# Patient Record
Sex: Female | Born: 1945 | Race: White | Hispanic: No | Smoking: Former smoker
Health system: Southern US, Community
[De-identification: ages and names within clinical notes are randomized; demographics above are authoritative.]

## PROBLEM LIST (undated history)

## (undated) DIAGNOSIS — M8580 Other specified disorders of bone density and structure, unspecified site: Secondary | ICD-10-CM

## (undated) DIAGNOSIS — A809 Acute poliomyelitis, unspecified: Secondary | ICD-10-CM

## (undated) DIAGNOSIS — G14 Postpolio syndrome: Secondary | ICD-10-CM

## (undated) HISTORY — DX: Other specified disorders of bone density and structure, unspecified site: M85.80

## (undated) HISTORY — DX: Postpolio syndrome: G14

## (undated) HISTORY — DX: Acute poliomyelitis, unspecified: A80.9

---
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
## 2015-03-29 ENCOUNTER — Encounter: Payer: Self-pay | Admitting: Internal Medicine

## 2015-03-29 ENCOUNTER — Ambulatory Visit
Admission: RE | Admit: 2015-03-29 | Discharge: 2015-03-29 | Disposition: A | Discharge status: Home / Self Care | Payer: PPO | Source: Ambulatory Visit | Attending: Internal Medicine | Admitting: Internal Medicine

## 2015-03-29 ENCOUNTER — Ambulatory Visit (INDEPENDENT_AMBULATORY_CARE_PROVIDER_SITE_OTHER): Payer: PPO | Admitting: Internal Medicine

## 2015-03-29 VITALS — BP 138/88 | HR 80 | Ht 61.0 in | Wt 169.0 lb

## 2015-03-29 DIAGNOSIS — G14 Postpolio syndrome: Secondary | ICD-10-CM | POA: Diagnosis not present

## 2015-03-29 DIAGNOSIS — E559 Vitamin D deficiency, unspecified: Secondary | ICD-10-CM | POA: Insufficient documentation

## 2015-03-29 DIAGNOSIS — M8580 Other specified disorders of bone density and structure, unspecified site: Secondary | ICD-10-CM | POA: Diagnosis not present

## 2015-03-29 DIAGNOSIS — Z720 Tobacco use: Secondary | ICD-10-CM | POA: Diagnosis not present

## 2015-03-29 DIAGNOSIS — E785 Hyperlipidemia, unspecified: Secondary | ICD-10-CM | POA: Diagnosis not present

## 2015-03-29 DIAGNOSIS — J40 Bronchitis, not specified as acute or chronic: Secondary | ICD-10-CM | POA: Insufficient documentation

## 2015-03-29 DIAGNOSIS — F17201 Nicotine dependence, unspecified, in remission: Secondary | ICD-10-CM

## 2015-03-29 MED ORDER — LEVOFLOXACIN 500 MG PO TABS: 500.0000 mg | ORAL_TABLET | Freq: Every day | ORAL | Status: DC

## 2015-03-29 MED ORDER — ALBUTEROL SULFATE HFA 108 (90 BASE) MCG/ACT IN AERS
2.0000 | INHALATION_SPRAY | Freq: Four times a day (QID) | RESPIRATORY_TRACT | Status: DC | PRN
Start: 2015-03-29 — End: 2015-07-19

## 2015-03-29 NOTE — Progress Notes (Signed)
Date:  03/29/2015   Name:  Bailey Page   DOB:  07-16-1945   MRN:  [id number]   Chief Complaint: New Evaluation and Sinusitis Sinusitis This is a recurrent problem. The current episode started more than 1 year ago. The problem has been waxing and waning since onset. Associated symptoms include congestion, coughing, shortness of breath, sinus pressure and a sore throat. Pertinent negatives include no chills. Past treatments include antibiotics and oral decongestants.   She also reports a chronic cough for the past month and a half. Has been diagnosed with mycoplasma pneumonia twice in the past 15 years. Feels similar with dry cough mild shortness of breath and mild wheezing and fatigue.  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  This is better since retiring.  She takes advil. She also has fatigue that she suspects is related to postpolio syndrome. She has some diaphragmatic dysfunction that contributes to shortness of breath. Polio was at age 70 and resulted in shortening and weakness of her left leg.  Review of Systems  Constitutional: Positive for fatigue. Negative for fever, chills and unexpected weight change.  HENT: Positive for congestion, sinus pressure and sore throat.   Eyes: Negative for visual disturbance.  Respiratory: Positive for cough, shortness of breath and wheezing. Negative for choking and chest tightness.   Cardiovascular: Negative for chest pain, palpitations and leg swelling.  Gastrointestinal: Negative for abdominal pain and blood in stool.  Musculoskeletal: Positive for arthralgias and gait problem.  Skin: Negative for color change and rash.    Patient Active Problem List   Diagnosis Date Noted  . 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 Amoxicillin-Pot Clavulanate Diarrhea    Tolerates just Amoxicillin without difficulty per patient.  . Sulfa  Antibiotics Rash    Past Surgical History  Procedure Laterality Date  . Cesarean section  [id number]  . Muscle transfer left foot Left 1960  . Tonsillectomy  1952    Social History  Substance Use Topics  . Smoking status: Former Smoker    Quit date: 03/14/2007  . Smokeless tobacco: None  . Alcohol Use: No    Medication list has been reviewed and updated.   Physical Exam  Constitutional: She is oriented to person, place, and time. She appears well-developed. No distress.  HENT:  Head: Normocephalic and atraumatic.  Neck: Normal range of motion. Neck supple. No thyromegaly present.  Cardiovascular: Normal rate, regular rhythm and normal heart sounds.   Pulmonary/Chest: Effort normal. No respiratory distress. She has decreased breath sounds. She has no wheezes. She has no rhonchi.  Lymphadenopathy:    She has no cervical adenopathy.  Neurological: She is alert and oriented to person, place, and time.  Skin: Skin is warm and dry. No rash noted.  Psychiatric: She has a normal mood and affect. Her speech is normal and behavior is normal. Thought content normal.  Nursing note and vitals reviewed.   BP 146/88 mmHg  Pulse 80  Ht 5\' 1"  (1.549 m)  Wt 169 lb (76.658 kg)  BMI 31.95 kg/m2  Assessment and Plan: 1. Bronchitis Will refer to Pulmonary if persistent and/or CXR abnormal - levofloxacin (LEVAQUIN) 500 MG tablet; Take 1 tablet (500 mg total) by mouth daily.  Dispense: 10 tablet; Refill: 0 - DG Chest 2 View; Future - albuterol (PROVENTIL HFA;VENTOLIN HFA) 108 (90 Base) MCG/ACT inhaler; Inhale 2 puffs  into the lungs every 6 (six) hours as needed for wheezing or shortness of breath.  Dispense: 1 Inhaler; Refill: 0  2. Post poliomyelitis syndrome  3. Tobacco use disorder, mild, in sustained remission  4. Hyperlipidemia, mild No medication recommended at this time  5. Vitamin D deficiency Recommend Vitamin D 2000 IU daily  6. Osteopenia Recommend calcium 1200 mg per  day Weight bearing exercise as able   Bari Edward, MD [facility]  03/29/2015

## 2015-03-30 ENCOUNTER — Telehealth: Payer: Self-pay

## 2015-03-30 NOTE — Telephone Encounter (Signed)
-----   Message from Reubin Milan, MD sent at 03/29/2015  5:03 PM EST ----- CXR is normal.  Follow up after antibiotics if still having symptoms for Pulmonary referral.

## 2015-03-30 NOTE — Telephone Encounter (Signed)
Spoke with pt. Pt. Advised of all results and verbalized understanding. MAH 

## 2015-03-30 NOTE — Telephone Encounter (Signed)
Left message for patient to call back. MAH 

## 2015-06-29 ENCOUNTER — Encounter: Payer: PPO | Admitting: Internal Medicine

## 2015-07-19 ENCOUNTER — Ambulatory Visit (INDEPENDENT_AMBULATORY_CARE_PROVIDER_SITE_OTHER): Payer: PPO | Admitting: Internal Medicine

## 2015-07-19 ENCOUNTER — Encounter: Payer: Self-pay | Admitting: Internal Medicine

## 2015-07-19 VITALS — BP 126/78 | HR 67 | Resp 16 | Ht 60.0 in | Wt 169.0 lb

## 2015-07-19 DIAGNOSIS — M8580 Other specified disorders of bone density and structure, unspecified site: Secondary | ICD-10-CM

## 2015-07-19 DIAGNOSIS — G14 Postpolio syndrome: Secondary | ICD-10-CM

## 2015-07-19 DIAGNOSIS — Z23 Encounter for immunization: Secondary | ICD-10-CM | POA: Diagnosis not present

## 2015-07-19 DIAGNOSIS — Z0000 Encounter for general adult medical examination without abnormal findings: Secondary | ICD-10-CM | POA: Diagnosis not present

## 2015-07-19 DIAGNOSIS — E559 Vitamin D deficiency, unspecified: Secondary | ICD-10-CM

## 2015-07-19 DIAGNOSIS — Z1231 Encounter for screening mammogram for malignant neoplasm of breast: Secondary | ICD-10-CM

## 2015-07-19 DIAGNOSIS — E785 Hyperlipidemia, unspecified: Secondary | ICD-10-CM

## 2015-07-19 LAB — POCT URINALYSIS DIPSTICK
Bilirubin, UA: NEGATIVE
GLUCOSE UA: NEGATIVE
Ketones, UA: NEGATIVE
Leukocytes, UA: NEGATIVE
NITRITE UA: NEGATIVE
Protein, UA: NEGATIVE
RBC UA: NEGATIVE
Spec Grav, UA: 1.01
Urobilinogen, UA: 0.2
pH, UA: 7

## 2015-07-19 NOTE — Progress Notes (Signed)
Patient: Bailey Page, Female    DOB: January 25, 1946, 70 y.o.   MRN: [id number] Visit Date: 07/19/2015  Today's Provider: Bari Edward, MD   Chief Complaint  Patient presents with  . Medicare Wellness    discuss vaccines and mammogram    Subjective:    Annual wellness visit Ghada Abbett is a 70 y.o. female who presents today for her Subsequent Annual Wellness Visit. She feels fairly well. She reports exercising by walking. She reports she is sleeping fairly well. She is due for a mammogram.  ----------------------------------------------------------- HPI  Review of Systems  Constitutional: Negative for fever, chills and fatigue.  HENT: Negative for ear pain, hearing loss, postnasal drip and sinus pressure.   Eyes: Negative for visual disturbance.  Respiratory: Negative for cough, chest tightness, shortness of breath and wheezing.   Cardiovascular: Negative for chest pain, palpitations and leg swelling.  Gastrointestinal: Negative for abdominal pain and blood in stool.  Genitourinary: Negative for hematuria and difficulty urinating.  Musculoskeletal: Positive for arthralgias and gait problem. Negative for joint swelling.  Skin: Negative for rash.       Large keratosis on back  Neurological: Negative for dizziness, syncope and headaches.  Hematological: Negative for adenopathy.  Psychiatric/Behavioral: Negative for confusion, sleep disturbance and dysphoric mood.    Social History   Social History  . Marital Status: Single    Spouse Name: N/A  . Number of Children: N/A  . Years of Education: N/A   Occupational History  . Not on file.   Social History Main Topics  . Smoking status: Former Smoker -- 18.00 packs/day for 15 years    Types: Cigarettes    Quit date: 03/14/2007  . Smokeless tobacco: Not on file  . Alcohol Use: No  . Drug Use: No  . Sexual Activity: Not on file   Other Topics Concern  . Not on file   Social History Narrative   Moved to [facility] 2016  with same sex domestic partner.   Disabled due to post polio syndrome prior to [facility] and Medicare coverage    Patient Active Problem List   Diagnosis Date Noted  . 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 Hyperlipidemia, mild 03/29/2015  . Vitamin D deficiency 03/29/2015  . Osteopenia 03/29/2015  . Tobacco use disorder, mild, in sustained remission 03/29/2015    Past Surgical History  Procedure Laterality Date  . Cesarean section  [id number]  . Muscle transfer left foot Left 1960  . Tonsillectomy  1952    Her family history includes Hypertension in her mother; Throat cancer (age of onset: 21) in her father.    Previous Medications   ASCORBIC ACID (VITAMIN C) 100 MG TABLET    Take 100 mg by mouth daily.   CHOLECALCIFEROL (VITAMIN D PO)    Take 5,000 Units by mouth.   MULTIPLE VITAMIN (MULTIVITAMIN) TABLET    Take 1 tablet by mouth daily.    No care team member to display     Objective:   Vitals: BP 126/78 mmHg  Pulse 67  Resp 16  Ht 5' (1.524 m)  Wt 169 lb (76.658 kg)  BMI 33.01 kg/m2  SpO2 98%  Physical Exam  Constitutional: She is oriented to person, place, and time. She appears well-developed and well-nourished. No distress.  HENT:  Head: Normocephalic and atraumatic.  Right Ear: Tympanic membrane and ear canal normal.  Left Ear: Tympanic membrane and ear canal normal.  Nose: Right sinus exhibits no maxillary sinus tenderness. Left sinus exhibits no maxillary  sinus tenderness.  Mouth/Throat: Uvula is midline and oropharynx is clear and moist.  Eyes: Conjunctivae and EOM are normal. Right eye exhibits no discharge. Left eye exhibits no discharge. No scleral icterus.  Neck: Normal range of motion. Carotid bruit is not present. No erythema present. No thyromegaly present.  Cardiovascular: Normal rate, regular rhythm, normal heart sounds and normal pulses.   Pulmonary/Chest: Effort normal. No respiratory distress. She has no wheezes. Right breast exhibits tenderness.  Right breast exhibits no mass, no nipple discharge and no skin change. Left breast exhibits tenderness. Left breast exhibits no mass, no nipple discharge and no skin change.  Abdominal: Soft. Bowel sounds are normal. There is no hepatosplenomegaly. There is no tenderness. There is no CVA tenderness.  Musculoskeletal: Normal range of motion. She exhibits tenderness. She exhibits no edema.       Right knee: Normal.       Left knee: Normal.  Left leg shortened from polio  Lymphadenopathy:    She has no cervical adenopathy.    She has no axillary adenopathy.  Neurological: She is alert and oriented to person, place, and time. She has normal reflexes. No cranial nerve deficit or sensory deficit.  Skin: Skin is warm, dry and intact. No rash noted.     Psychiatric: She has a normal mood and affect. Her speech is normal and behavior is normal. Thought content normal.  Nursing note and vitals reviewed.   Activities of Daily Living In your present state of health, do you have any difficulty performing the following activities: 07/19/2015 03/29/2015  Hearing? N N  Vision? N Y  Difficulty concentrating or making decisions? N N  Walking or climbing stairs? N Y  Dressing or bathing? N N  Doing errands, shopping? N N  Preparing Food and eating ? N -  Using the Toilet? N -  In the past six months, have you accidently leaked urine? N -  Do you have problems with loss of bowel control? N -  Managing your Medications? N -  Managing your Finances? N -  Housekeeping or managing your Housekeeping? N -    Fall Risk Assessment Fall Risk  07/19/2015 03/29/2015  Falls in the past year? No No      Depression Screen PHQ 2/9 Scores 07/19/2015 03/29/2015  PHQ - 2 Score 0 2  PHQ- 9 Score - 4    Cognitive Testing - 6-CIT   Correct? Score   What year is it? yes 0 Yes = 0    No = 4  What month is it? yes 0 Yes = 0    No = 3  Remember:     Floyde ParkinsJohn Smith, [address] [place] [place], [place]     What time is it? yes 0 Yes =  0    No = 3  Count backwards from 20 to 1 yes 0 Correct = 0    1 error = 2   More than 1 error = 4  Say the months of the year in reverse. yes 0 Correct = 0    1 error = 2   More than 1 error = 4  What address did I ask you to remember? yes 0 Correct = 0  1 error = 2    2 error = 4    3 error = 6    4 error = 8    All wrong = 10       TOTAL SCORE  0/28   Interpretation:  Normal  Normal (0-7) Abnormal (8-28)        Medicare Annual Wellness Visit Summary:  Reviewed patient's Family Medical History Reviewed and updated list of patient's medical providers Assessment of cognitive impairment was done Assessed patient's functional ability Established a written schedule for health screening services Health Risk Assessent Completed and Reviewed  Exercise Activities and Dietary recommendations Goals    None      There is no immunization history for the selected administration types on file for this patient.  Health Maintenance  Topic Date Due  . TETANUS/TDAP  03/11/1965  . ZOSTAVAX  03/11/2006  . PNA vac Low Risk Adult (1 of 2 - PCV13) 03/12/2011  . MAMMOGRAM  03/13/2014  . INFLUENZA VACCINE  10/12/2015  . COLONOSCOPY  03/13/2018  . DEXA SCAN  Completed  . Hepatitis C Screening  Addressed     Discussed health benefits of physical activity, and encouraged her to engage in regular exercise appropriate for her age and condition.    ------------------------------------------------------------------------------------------------------------   Assessment & Plan:     1. Medicare annual wellness visit, subsequent completed - CBC with Differential/Platelet - TSH - POCT urinalysis dipstick  2. Post poliomyelitis syndrome stable  3. Hyperlipidemia, mild Continue dietary modifications - Lipid panel  4. Vitamin D deficiency On supplements - VITAMIN D 25 Hydroxy (Vit-D Deficiency, Fractures)  5. Osteopenia DEXA done in 2015 - Comprehensive metabolic panel  6. Encounter  for screening mammogram for breast cancer - MM DIGITAL SCREENING BILATERAL; Future  7. Need for diphtheria-tetanus-pertussis (Tdap) vaccine - Tdap vaccine greater than or equal to 7yo IM   Rx for Zostavax given to be obtained at the pharmacy after August 30, 2015. Prevnar-13 and PPV-23 schedule discussed.  Bari Edward, MD [facility]  07/19/2015

## 2015-07-19 NOTE — Patient Instructions (Addendum)
Td Vaccine (Tetanus and Diphtheria): What You Need to Know 1. Why get vaccinated? Tetanus  and diphtheria are very serious diseases. They are rare in the [place] today, but people who do become infected often have severe complications. Td vaccine is used to protect adolescents and adults from both of these diseases. Both tetanus and diphtheria are infections caused by bacteria. Diphtheria spreads from person to person through coughing or sneezing. Tetanus-causing bacteria enter the body through cuts, scratches, or wounds. TETANUS (Lockjaw) causes painful muscle tightening and stiffness, usually all over the body.  It can lead to tightening of muscles in the head and neck so you can't open your mouth, swallow, or sometimes even breathe. Tetanus kills about 1 out of every 10 people who are infected even after receiving the best medical care. DIPHTHERIA can cause a thick coating to form in the back of the throat.  It can lead to breathing problems, paralysis, heart failure, and death. Before vaccines, as many as 200,000 cases of diphtheria and hundreds of cases of tetanus were reported in the [place] each year. Since vaccination began, reports of cases for both diseases have dropped by about 99%. 2. Td vaccine Td vaccine can protect adolescents and adults from tetanus and diphtheria. Td is usually given as a booster dose every 10 years but it can also be given earlier after a severe and dirty wound or burn. Another vaccine, called Tdap, which protects against pertussis in addition to tetanus and diphtheria, is sometimes recommended instead of Td vaccine. Your doctor or the person giving you the vaccine can give you more information. Td may safely be given at the same time as other vaccines. 3. Some people should not get this vaccine  A person who has ever had a life-threatening allergic reaction after a previous dose of any tetanus or diphtheria containing vaccine, OR has a severe allergy  to any part of this vaccine, should not get Td vaccine. Tell the person giving the vaccine about any severe allergies.  Talk to your doctor if you:  have seizures or another nervous system problem,  had severe pain or swelling after any vaccine containing diphtheria or tetanus,  ever had a condition called Guillain Barre Syndrome (GBS),  aren't feeling well on the day the shot is scheduled. 4. Risks of a vaccine reaction With any medicine, including vaccines, there is a chance of side effects. These are usually mild and go away on their own. Serious reactions are also possible but are rare. Most people who get Td vaccine do not have any problems with it. Mild Problems  following Td vaccine: (Did not interfere with activities)  Pain where the shot was given (about 8 people in 10)  Redness or swelling where the shot was given (about 1 person in 4)  Mild fever (rare)  Headache (about 1 person in 4)  Tiredness (about 1 person in 4) Moderate Problems following Td vaccine: (Interfered with activities, but did not require medical attention)  Fever over 102F (rare) Severe Problems  following Td vaccine: (Unable to perform usual activities; required medical attention)  Swelling, severe pain, bleeding and/or redness in the arm where the shot was given (rare). Problems that could happen after any vaccine:  People sometimes faint after a medical procedure, including vaccination. Sitting or lying down for about 15 minutes can help prevent fainting, and injuries caused by a fall. Tell your doctor if you feel dizzy, or have vision changes or ringing in the ears.  Some people get severe pain in the shoulder and have difficulty moving the arm where a shot was given. This happens very rarely.  Any medication can cause a severe allergic reaction. Such reactions from a vaccine are very rare, estimated at fewer than 1 in a million doses, and would happen within a few minutes to a few hours after  the vaccination. As with any medicine, there is a very remote chance of a vaccine causing a serious injury or death. The safety of vaccines is always being monitored. For more information, visit: http://floyd.org/www.cdc.gov/vaccinesafety/ 5. What if there is a serious reaction? What should I look for?  Look for anything that concerns you, such as signs of a severe allergic reaction, very high fever, or unusual behavior. Signs of a severe allergic reaction can include hives, swelling of the face and throat, difficulty breathing, a fast heartbeat, dizziness, and weakness. These would usually start a few minutes to a few hours after the vaccination. What should I do?  If you think it is a severe allergic reaction or other emergency that can't wait, call 9-1-1 or get the person to the nearest hospital. Otherwise, call your doctor.  Afterward, the reaction should be reported to the Vaccine Adverse Event Reporting System (VAERS). Your doctor might file this report, or you can do it yourself through the VAERS web site at www.vaers.LAgents.nohhs.gov, or by calling 1-(563) 426-8155. VAERS does not give medical advice. 6. The National Vaccine Injury Compensation Program The [employer] Vaccine Injury Compensation Program (VICP) is a federal program that was created to compensate people who may have been injured by certain vaccines. Persons who believe they may have been injured by a vaccine can learn about the program and about filing a claim by calling 1-780-454-2159 or visiting the VICP website at SpiritualWord.atwww.hrsa.gov/vaccinecompensation. There is a time limit to file a claim for compensation. 7. How can I learn more?  Ask your doctor. He or she can give you the vaccine package insert or suggest other sources of information.  Call your local or state health department.  Contact the Centers for Disease Control and Prevention (CDC):  Call (302)694-55631-239-367-7500 (1-800-CDC-INFO)  Visit CDC's website at PicCapture.uywww.cdc.gov/vaccines CDC Td Vaccine VIS  (05/06/13)   This information is not intended to replace advice given to you by your health care provider. Make sure you discuss any questions you have with your health care provider.   Document Released: 12/25/2005 Document Revised: 03/20/2014 Document Reviewed: 06/11/2013 Elsevier Interactive Patient Education 2016 [employer]   Health Maintenance  Topic Date Due  . Janet BerlinETANUS/TDAP  03/11/1965  . ZOSTAVAX  03/11/2006  . PNA vac Low Risk Adult (1 of 2 - PCV13) 03/12/2011  . MAMMOGRAM  03/13/2014  . INFLUENZA VACCINE  10/12/2015  . COLONOSCOPY  03/13/2018  . DEXA SCAN  Completed  . Hepatitis C Screening  Addressed

## 2015-07-20 LAB — COMPREHENSIVE METABOLIC PANEL
A/G RATIO: 1.8 (ref 1.2–2.2)
ALT: 18 IU/L (ref 0–32)
AST: 20 IU/L (ref 0–40)
Albumin: 4.2 g/dL (ref 3.6–4.8)
Alkaline Phosphatase: 54 IU/L (ref 39–117)
BUN/Creatinine Ratio: 22 (ref 12–28)
BUN: 16 mg/dL (ref 8–27)
Bilirubin Total: 0.5 mg/dL (ref 0.0–1.2)
CALCIUM: 9.5 mg/dL (ref 8.7–10.3)
CO2: 23 mmol/L (ref 18–29)
CREATININE: 0.72 mg/dL (ref 0.57–1.00)
Chloride: 99 mmol/L (ref 96–106)
GFR, EST AFRICAN AMERICAN: 99 mL/min/{1.73_m2} (ref >59)
GFR, EST NON AFRICAN AMERICAN: 86 mL/min/{1.73_m2} (ref >59)
GLUCOSE: 102 mg/dL — AB (ref 65–99)
Globulin, Total: 2.3 g/dL (ref 1.5–4.5)
POTASSIUM: 4.9 mmol/L (ref 3.5–5.2)
Sodium: 138 mmol/L (ref 134–144)
TOTAL PROTEIN: 6.5 g/dL (ref 6.0–8.5)

## 2015-07-20 LAB — CBC WITH DIFFERENTIAL/PLATELET
BASOS ABS: 0 10*3/uL (ref 0.0–0.2)
Basos: 1 %
EOS (ABSOLUTE): 0.2 10*3/uL (ref 0.0–0.4)
Eos: 3 %
Hematocrit: 41.9 % (ref 34.0–46.6)
Hemoglobin: 14 g/dL (ref 11.1–15.9)
Immature Grans (Abs): 0 10*3/uL (ref 0.0–0.1)
Immature Granulocytes: 0 %
LYMPHS ABS: 2.3 10*3/uL (ref 0.7–3.1)
Lymphs: 44 %
MCH: 31.7 pg (ref 26.6–33.0)
MCHC: 33.4 g/dL (ref 31.5–35.7)
MCV: 95 fL (ref 79–97)
Monocytes Absolute: 0.4 10*3/uL (ref 0.1–0.9)
Monocytes: 7 %
NEUTROS ABS: 2.4 10*3/uL (ref 1.4–7.0)
Neutrophils: 45 %
PLATELETS: 206 10*3/uL (ref 150–379)
RBC: 4.42 x10E6/uL (ref 3.77–5.28)
RDW: 14.2 % (ref 12.3–15.4)
WBC: 5.3 10*3/uL (ref 3.4–10.8)

## 2015-07-20 LAB — LIPID PANEL
CHOL/HDL RATIO: 4 ratio (ref 0.0–4.4)
Cholesterol, Total: 219 mg/dL — ABNORMAL HIGH (ref 100–199)
HDL: 55 mg/dL (ref >39)
LDL CALC: 138 mg/dL — AB (ref 0–99)
TRIGLYCERIDES: 132 mg/dL (ref 0–149)
VLDL CHOLESTEROL CAL: 26 mg/dL (ref 5–40)

## 2015-07-20 LAB — VITAMIN D 25 HYDROXY (VIT D DEFICIENCY, FRACTURES): Vit D, 25-Hydroxy: 28.4 ng/mL — ABNORMAL LOW (ref 30.0–100.0)

## 2015-07-20 LAB — TSH: TSH: 1.39 u[IU]/mL (ref 0.450–4.500)

## 2017-03-14 IMAGING — CR DG CHEST 2V
2 series · 2 of 2 positions shown · non-contrast
Comparison: None.

CLINICAL DATA: Intermittent dry cough since February 2015. Initial
encounter.

EXAM:
CHEST  2 VIEW

[chest pa]
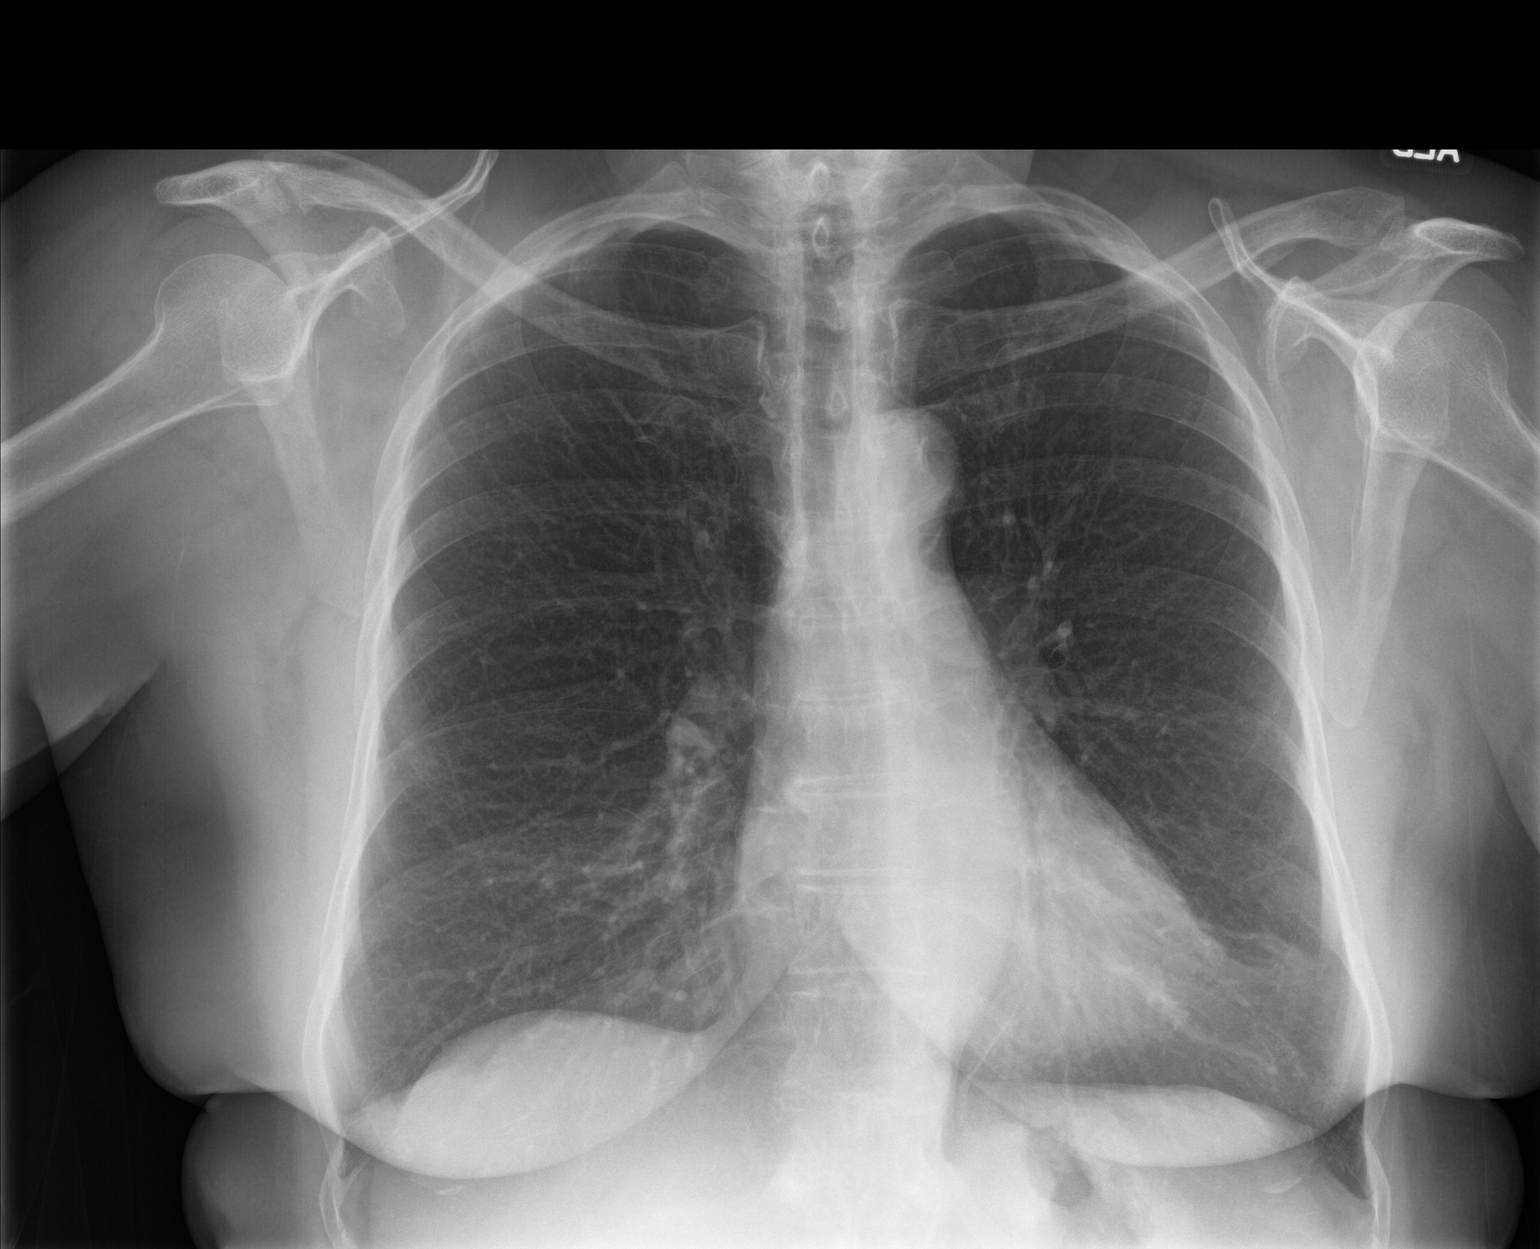

[chest lat]
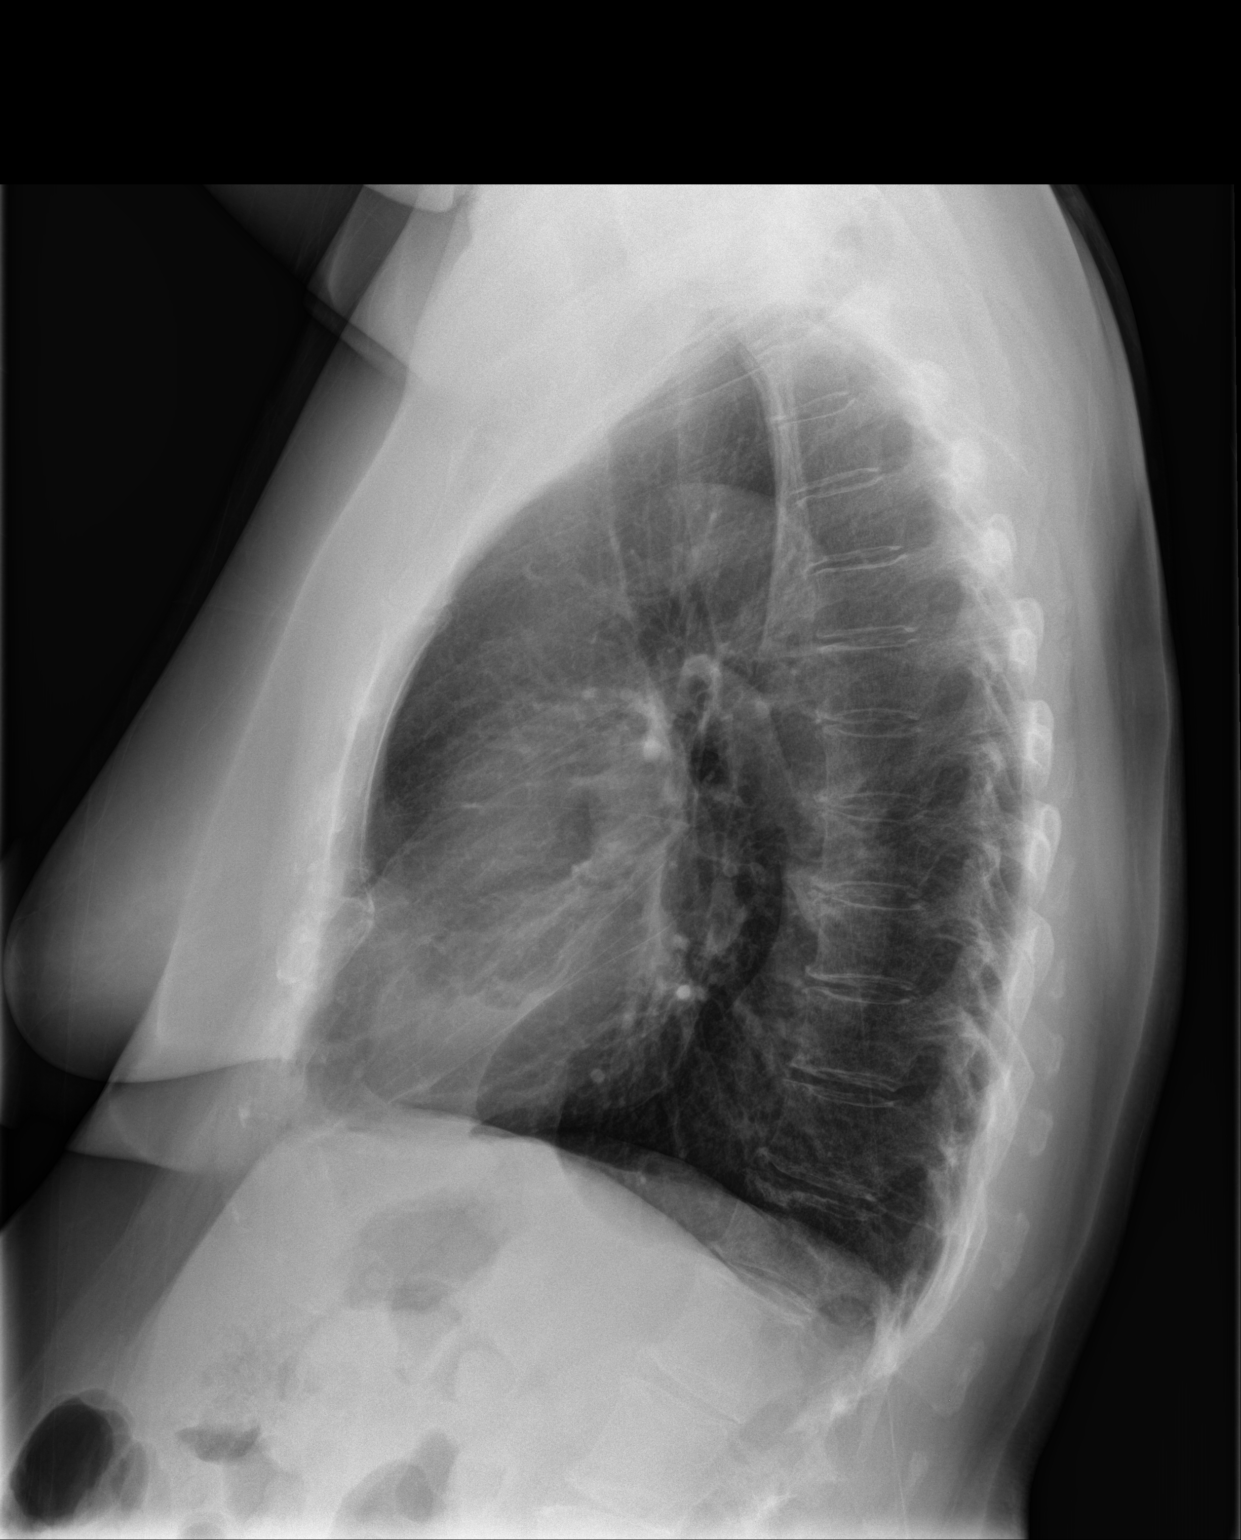

[2 of 2 positions shown; findings below may reference images not displayed]

FINDINGS: The lungs are clear. Heart size is normal. There is no pneumothorax
or pleural effusion. No focal bony abnormality.
IMPRESSION: No acute disease.
